# Patient Record
Sex: Female | Born: 1965 | Race: Black or African American | Hispanic: No | Marital: Single | Smoking: Never smoker
Health system: Southern US, Community
[De-identification: ages and names within clinical notes are randomized; demographics above are authoritative.]

## PROBLEM LIST (undated history)

## (undated) DIAGNOSIS — M109 Gout, unspecified: Secondary | ICD-10-CM

## (undated) DIAGNOSIS — G8929 Other chronic pain: Secondary | ICD-10-CM

## (undated) DIAGNOSIS — M549 Dorsalgia, unspecified: Secondary | ICD-10-CM

## (undated) DIAGNOSIS — M25569 Pain in unspecified knee: Secondary | ICD-10-CM

## (undated) DIAGNOSIS — I10 Essential (primary) hypertension: Secondary | ICD-10-CM

## (undated) DIAGNOSIS — B009 Herpesviral infection, unspecified: Secondary | ICD-10-CM

## (undated) HISTORY — PX: HERNIA REPAIR: SHX51

## (undated) HISTORY — PX: CHOLECYSTECTOMY: SHX55

---

## 2011-05-16 ENCOUNTER — Other Ambulatory Visit (HOSPITAL_COMMUNITY)
Admission: RE | Admit: 2011-05-16 | Discharge: 2011-05-16 | Disposition: A | Discharge status: Home / Self Care | Payer: Medicaid Other | Source: Ambulatory Visit | Attending: Obstetrics and Gynecology | Admitting: Obstetrics and Gynecology

## 2011-05-16 DIAGNOSIS — Z113 Encounter for screening for infections with a predominantly sexual mode of transmission: Secondary | ICD-10-CM | POA: Insufficient documentation

## 2011-05-16 DIAGNOSIS — Z01419 Encounter for gynecological examination (general) (routine) without abnormal findings: Secondary | ICD-10-CM | POA: Insufficient documentation

## 2012-06-17 ENCOUNTER — Other Ambulatory Visit (HOSPITAL_COMMUNITY)
Admission: RE | Admit: 2012-06-17 | Discharge: 2012-06-17 | Disposition: A | Discharge status: Home / Self Care | Payer: Medicaid Other | Source: Ambulatory Visit | Attending: Obstetrics and Gynecology | Admitting: Obstetrics and Gynecology

## 2012-06-17 DIAGNOSIS — Z124 Encounter for screening for malignant neoplasm of cervix: Secondary | ICD-10-CM | POA: Insufficient documentation

## 2012-06-17 DIAGNOSIS — Z113 Encounter for screening for infections with a predominantly sexual mode of transmission: Secondary | ICD-10-CM | POA: Insufficient documentation

## 2014-08-10 ENCOUNTER — Encounter (HOSPITAL_BASED_OUTPATIENT_CLINIC_OR_DEPARTMENT_OTHER): Payer: Self-pay

## 2014-08-10 DIAGNOSIS — I10 Essential (primary) hypertension: Secondary | ICD-10-CM | POA: Diagnosis not present

## 2014-08-10 DIAGNOSIS — Z8619 Personal history of other infectious and parasitic diseases: Secondary | ICD-10-CM | POA: Insufficient documentation

## 2014-08-10 DIAGNOSIS — M109 Gout, unspecified: Secondary | ICD-10-CM | POA: Diagnosis not present

## 2014-08-10 DIAGNOSIS — Z79899 Other long term (current) drug therapy: Secondary | ICD-10-CM | POA: Diagnosis not present

## 2014-08-10 DIAGNOSIS — G8929 Other chronic pain: Secondary | ICD-10-CM | POA: Diagnosis not present

## 2014-08-10 DIAGNOSIS — M25562 Pain in left knee: Secondary | ICD-10-CM | POA: Diagnosis present

## 2014-08-10 DIAGNOSIS — M1712 Unilateral primary osteoarthritis, left knee: Secondary | ICD-10-CM | POA: Diagnosis not present

## 2014-08-10 NOTE — ED Notes (Addendum)
C/o pain from left knee down leg posterior and anterior-started yesterday-denies injury-states she needs to have bilat knee replacement

## 2014-08-11 ENCOUNTER — Emergency Department (HOSPITAL_BASED_OUTPATIENT_CLINIC_OR_DEPARTMENT_OTHER)
Admission: EM | Admit: 2014-08-11 | Discharge: 2014-08-11 | Disposition: A | Discharge status: Home / Self Care | Payer: Medicaid Other | Attending: Emergency Medicine | Admitting: Emergency Medicine

## 2014-08-11 ENCOUNTER — Encounter (HOSPITAL_BASED_OUTPATIENT_CLINIC_OR_DEPARTMENT_OTHER): Payer: Self-pay | Admitting: Emergency Medicine

## 2014-08-11 ENCOUNTER — Emergency Department (HOSPITAL_BASED_OUTPATIENT_CLINIC_OR_DEPARTMENT_OTHER): Payer: Medicaid Other

## 2014-08-11 DIAGNOSIS — R52 Pain, unspecified: Secondary | ICD-10-CM

## 2014-08-11 DIAGNOSIS — M1990 Unspecified osteoarthritis, unspecified site: Secondary | ICD-10-CM

## 2014-08-11 HISTORY — DX: Gout, unspecified: M10.9

## 2014-08-11 HISTORY — DX: Essential (primary) hypertension: I10

## 2014-08-11 HISTORY — DX: Herpesviral infection, unspecified: B00.9

## 2014-08-11 HISTORY — DX: Pain in unspecified knee: M25.569

## 2014-08-11 HISTORY — DX: Dorsalgia, unspecified: M54.9

## 2014-08-11 HISTORY — DX: Other chronic pain: G89.29

## 2014-08-11 MED ORDER — METHOCARBAMOL 500 MG PO TABS
1000.0000 mg | ORAL_TABLET | Freq: Once | ORAL | Status: AC
  Administered 2014-08-11: 1000 mg via ORAL
  Filled 2014-08-11: qty 2

## 2014-08-11 MED ORDER — KETOROLAC TROMETHAMINE 60 MG/2ML IM SOLN
60.0000 mg | Freq: Once | INTRAMUSCULAR | Status: AC
  Administered 2014-08-11: 60 mg via INTRAMUSCULAR
  Filled 2014-08-11: qty 2

## 2014-08-11 MED ORDER — DEXAMETHASONE SODIUM PHOSPHATE 4 MG/ML IJ SOLN
4.0000 mg | Freq: Once | INTRAMUSCULAR | Status: AC
  Administered 2014-08-11: 4 mg via INTRAMUSCULAR
  Filled 2014-08-11: qty 1

## 2014-08-11 MED ORDER — MELOXICAM 7.5 MG PO TABS
7.5000 mg | ORAL_TABLET | Freq: Every day | ORAL | Status: AC
Start: 2014-08-11 — End: ?

## 2014-08-11 NOTE — ED Provider Notes (Signed)
CSN: [id number]     Arrival date & time 08/10/14  2220 History   First MD Initiated Contact with Patient 08/11/14 0114     Chief Complaint  Patient presents with  . Leg Pain     (Consider location/radiation/quality/duration/timing/severity/associated sxs/prior Treatment) Patient is a 48 y.o. female presenting with knee pain. The history is provided by the patient.  Knee Pain Location:  Knee Time since incident:  12 hours Injury: no (has chronic B knee pain that she used to get injections for she was supposed to have both knees repaired and was walking in parking lot at store today and does not remember injury but sudden worsening lateral left knee pain)   Knee location:  L knee Pain details:    Quality:  Sharp   Radiates to:  Does not radiate   Severity:  Severe   Onset quality:  Gradual   Timing:  Constant   Progression:  Worsening Chronicity:  Chronic Dislocation: no   Foreign body present:  No foreign bodies Prior injury to area:  No Relieved by:  Nothing Worsened by:  Nothing tried Ineffective treatments:  Muscle relaxant (lyrica and narcotic pain medication) Associated symptoms: no numbness and no swelling   Risk factors: no concern for non-accidental trauma     Past Medical History  Diagnosis Date  . Knee pain, chronic   . Hypertension   . Herpes simplex type 1 infection   . Gout   . Back pain, chronic    Past Surgical History  Procedure Laterality Date  . Cholecystectomy    . Hernia repair    . Cesarean section     No family history on file. History  Substance Use Topics  . Smoking status: Never Smoker   . Smokeless tobacco: Not on file  . Alcohol Use: No   OB History    No data available     Review of Systems  All other systems reviewed and are negative.     Allergies  Sulfa antibiotics; Other; and Red dye  Home Medications   Prior to Admission medications   Medication Sig Start Date End Date Taking? Authorizing Provider  COLCHICINE PO  Take by mouth.   Yes Historical Provider, MD  Cyclobenzaprine HCl (FLEXERIL PO) Take by mouth.   Yes Historical Provider, MD  LISINOPRIL PO Take by mouth.   Yes Historical Provider, MD  Loratadine 10 MG CAPS Take by mouth.   Yes Historical Provider, MD  Pregabalin (LYRICA PO) Take by mouth.   Yes Historical Provider, MD  Solifenacin Succinate (VESICARE PO) Take by mouth.   Yes Historical Provider, MD  TRAMADOL HCL PO Take by mouth.   Yes Historical Provider, MD  ValACYclovir HCl (VALTREX PO) Take by mouth.   Yes Historical Provider, MD  meloxicam (MOBIC) 7.5 MG tablet Take 1 tablet (7.5 mg total) by mouth daily. 08/11/14   Lynell Greenhouse K Kinlee Garrison-Rasch, MD   BP 151/95 mmHg  Pulse 84  Temp(Src) 98.6 F (37 C) (Oral)  Resp 20  Ht 5\' 6"  (1.676 m)  Wt 286 lb (129.729 kg)  BMI 46.18 kg/m2  SpO2 99% Physical Exam  Constitutional: She is oriented to person, place, and time. She appears well-developed and well-nourished. No distress.  HENT:  Head: Normocephalic and atraumatic.  Mouth/Throat: Oropharynx is clear and moist.  Eyes: Conjunctivae are normal. Pupils are equal, round, and reactive to light.  Neck: Normal range of motion. Neck supple.  Cardiovascular: Normal rate, regular rhythm and intact distal pulses.  Pulmonary/Chest: Effort normal and breath sounds normal. She has no wheezes. She has no rales.  Abdominal: Soft. Bowel sounds are normal. There is no tenderness. There is no rebound and no guarding.  Musculoskeletal: Normal range of motion.       Left knee: She exhibits normal range of motion, no swelling, no effusion, no ecchymosis, no deformity, no laceration, no erythema, normal alignment, no LCL laxity, normal patellar mobility, no bony tenderness, normal meniscus and no MCL laxity. No tenderness found. No medial joint line, no lateral joint line, no MCL, no LCL and no patellar tendon tenderness noted.  Neurological: She is alert and oriented to person, place, and time.  Skin: Skin is  warm and dry.  Psychiatric: She has a normal mood and affect.    ED Course  Procedures (including critical care time) Labs Review Labs Reviewed - No data to display  Imaging Review Dg Knee Complete 4 Views Left  08/11/2014   CLINICAL DATA:  Severe left knee pain for the past 2 days  EXAM: LEFT KNEE - COMPLETE 4+ VIEW  COMPARISON:  None.  FINDINGS: No joint effusion noted. There is no fracture or subluxation identified. Moderate tricompartment osteoarthritis with exuberant marginal spur formation and medial and patellofemoral compartment narrowing is noted. Loose bodies are identified within the anterior joint space.  IMPRESSION: 1. Moderate tricompartment osteoarthritis.   Electronically Signed   By: Signa Kellaylor  Stroud M.D.   On: 08/11/2014 02:14     EKG Interpretation None      MDM   Final diagnoses:  Pain  Arthritis    Knee immobilizer placed follow up with your orthopedic surgeon to discuss ongoing management of your severe arthritis.      Jasmine AweApril K Anila Bojarski-Rasch, MD 08/11/14 [phone]

## 2014-08-11 NOTE — ED Notes (Signed)
Pt to xray via stretcher

## 2015-08-28 ENCOUNTER — Ambulatory Visit: Payer: Self-pay | Admitting: Podiatry

## 2015-08-30 ENCOUNTER — Other Ambulatory Visit: Payer: Self-pay

## 2015-08-30 ENCOUNTER — Ambulatory Visit (INDEPENDENT_AMBULATORY_CARE_PROVIDER_SITE_OTHER): Payer: Medicaid Other | Admitting: Podiatry

## 2015-08-30 ENCOUNTER — Encounter: Payer: Self-pay | Admitting: Podiatry

## 2015-08-30 VITALS — BP 167/101 | HR 96 | Ht 66.0 in | Wt 280.0 lb

## 2015-08-30 DIAGNOSIS — M216X2 Other acquired deformities of left foot: Secondary | ICD-10-CM

## 2015-08-30 DIAGNOSIS — M216X1 Other acquired deformities of right foot: Secondary | ICD-10-CM

## 2015-08-30 DIAGNOSIS — M216X9 Other acquired deformities of unspecified foot: Secondary | ICD-10-CM | POA: Diagnosis not present

## 2015-08-30 DIAGNOSIS — M722 Plantar fascial fibromatosis: Secondary | ICD-10-CM | POA: Diagnosis not present

## 2015-08-30 DIAGNOSIS — M21969 Unspecified acquired deformity of unspecified lower leg: Secondary | ICD-10-CM | POA: Diagnosis not present

## 2015-08-30 NOTE — Progress Notes (Signed)
SUBJECTIVE: 49 y.o. year old female presents with pain in left heel.  Left heel pain x 1 month. 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 Using CAM walker she received from Orthopedic doctor who was treating her for knee pain 2 weeks ago. Has had cortisone injection in past for back and knee that did not help her.  Been having knee problem for about 2 years.  CAM walker helps some. Already taking 3 pain medications.  Need to have knee replacement once she lose 30 lbs.   REVIEW OF SYSTEMS: Constitutional: negative for chills, fatigue, fevers, night sweats, sweats and weight loss Eyes: negative Ears, nose, mouth, throat, and face: negative Respiratory: negative Cardiovascular: negative Gastrointestinal: negative Genitourinary:negative Integument/breast: negative Musculoskeletal:negative Neurological: negative Behavioral/Psych: negative Endocrine: negative Allergic/Immunologic: negative  Positive for hiatal hernia.   OBJECTIVE: DERMATOLOGIC EXAMINATION: Nails: normal appearing nails bilaterally Skin Integrity: Normal findings.  VASCULAR EXAMINATION OF LOWER LIMBS: Pedal pulses: All pedal pulses are palpable with normal pulsation.  Temperature gradient from tibial crest to dorsum of foot is within normal bilateral. NEUROLOGIC EXAMINATION OF THE LOWER LIMBS: All epicritic and tactile sensations grossly intact. MUSCULOSKELETAL EXAMINATION: Positive for unstable first ray bilateral. Left plantar heel pain.   RADIOGRAPHIC STUDIES:  General overview: Negative of Osteopenia Absence of soft tissue swelling. Absence of acute osseous or articular surfaces of forefoot or rearfoot.  AP View:  Short first ray (-3), fibular sesamoid position 5 bilateral,  increased lateral deviation angle of Calcaneocuboid angle bilateral , phalangeal spur proximal phalanx 5th left,  Lateral view:  Pronated foot with midtarsal sagging  Dorsal lipping at Naviculocuneiform and metatarsocuneiform bilateral. Decreased Calcaneal  inclination angle, Positive of posterior heel spur bilateral, positive of inferior calcaneal spur, positive of first ray elevatus bilateral, Positive of anterior and plantar advancement of the Talar head.  ASSESSMENT: Plantar fasciitis left. Unstable first ray bilateral. STJ excess pronation bilateral.  PLAN: Reviewed clinical findings and all available treatment options, orthotics OTC and custom, Cortisone injection,

## 2015-08-30 NOTE — Patient Instructions (Signed)
Seen for left heel pain. Noted of unstable first metatarsal bone with small bone spur left foot. May benefit from cortisone injection, Orthotics custom or OTC, NSAIA, pain medication, change in shoe gear. Return as needed.

## 2017-07-22 ENCOUNTER — Emergency Department (HOSPITAL_BASED_OUTPATIENT_CLINIC_OR_DEPARTMENT_OTHER)
Admission: EM | Admit: 2017-07-22 | Discharge: 2017-07-23 | Disposition: A | Discharge status: Home / Self Care | Payer: Self-pay | Attending: Emergency Medicine | Admitting: Emergency Medicine

## 2017-07-22 ENCOUNTER — Other Ambulatory Visit: Payer: Self-pay

## 2017-07-22 ENCOUNTER — Emergency Department (HOSPITAL_BASED_OUTPATIENT_CLINIC_OR_DEPARTMENT_OTHER): Payer: Self-pay

## 2017-07-22 ENCOUNTER — Encounter (HOSPITAL_BASED_OUTPATIENT_CLINIC_OR_DEPARTMENT_OTHER): Payer: Self-pay | Admitting: Emergency Medicine

## 2017-07-22 DIAGNOSIS — I209 Angina pectoris, unspecified: Secondary | ICD-10-CM | POA: Insufficient documentation

## 2017-07-22 DIAGNOSIS — I10 Essential (primary) hypertension: Secondary | ICD-10-CM | POA: Insufficient documentation

## 2017-07-22 DIAGNOSIS — Z79899 Other long term (current) drug therapy: Secondary | ICD-10-CM | POA: Insufficient documentation

## 2017-07-22 DIAGNOSIS — R0602 Shortness of breath: Secondary | ICD-10-CM | POA: Insufficient documentation

## 2017-07-22 DIAGNOSIS — R079 Chest pain, unspecified: Secondary | ICD-10-CM

## 2017-07-22 LAB — CBC WITH DIFFERENTIAL/PLATELET
BASOS ABS: 0 10*3/uL (ref 0.0–0.1)
Basophils Relative: 0 %
Eosinophils Absolute: 0.2 10*3/uL (ref 0.0–0.7)
Eosinophils Relative: 2 %
HCT: 39.3 % (ref 36.0–46.0)
Hemoglobin: 12.7 g/dL (ref 12.0–15.0)
LYMPHS ABS: 4.2 10*3/uL — AB (ref 0.7–4.0)
Lymphocytes Relative: 52 %
MCH: 30.8 pg (ref 26.0–34.0)
MCHC: 32.3 g/dL (ref 30.0–36.0)
MCV: 95.4 fL (ref 78.0–100.0)
Monocytes Absolute: 0.4 10*3/uL (ref 0.1–1.0)
Monocytes Relative: 5 %
NEUTROS ABS: 3.3 10*3/uL (ref 1.7–7.7)
Neutrophils Relative %: 41 %
Platelets: 250 10*3/uL (ref 150–400)
RBC: 4.12 MIL/uL (ref 3.87–5.11)
RDW: 13.1 % (ref 11.5–15.5)
WBC: 8 10*3/uL (ref 4.0–10.5)

## 2017-07-22 LAB — BASIC METABOLIC PANEL
ANION GAP: 7 (ref 5–15)
BUN: 12 mg/dL (ref 6–20)
CHLORIDE: 107 mmol/L (ref 101–111)
CO2: 26 mmol/L (ref 22–32)
Calcium: 8.8 mg/dL — ABNORMAL LOW (ref 8.9–10.3)
Creatinine, Ser: 0.97 mg/dL (ref 0.44–1.00)
GFR calc Af Amer: >60 mL/min (ref >60)
GLUCOSE: 92 mg/dL (ref 65–99)
POTASSIUM: 3.3 mmol/L — AB (ref 3.5–5.1)
Sodium: 140 mmol/L (ref 135–145)

## 2017-07-22 LAB — TROPONIN I: Troponin I: <0.03 ng/mL (ref <0.03)

## 2017-07-22 MED ORDER — ASPIRIN 81 MG PO CHEW
324.0000 mg | CHEWABLE_TABLET | Freq: Once | ORAL | Status: AC
  Administered 2017-07-22: 324 mg via ORAL
  Filled 2017-07-22: qty 4

## 2017-07-22 NOTE — ED Triage Notes (Signed)
Pt c/o aching type pain in left upper chest that radiates to left arm with associated shob. Pt reports onset of pain 2 months ago.

## 2017-07-22 NOTE — ED Provider Notes (Addendum)
[facility] EMERGENCY DEPARTMENT Provider Note   CSN: [id number] Arrival date & time: 07/22/17  2107     History   Chief Complaint Chief Complaint  Patient presents with  . Chest Pain    HPI Megan Collier is a 51 y.o. female.  HPI 51 year old female with history of obesity, hypertension who comes in with chief complaint of chest pain.  Patient reports that she has been having left-sided chest discomfort for the past 7 straight days.  Pain is constant, radiates down towards the left elbow.  There is no specific aggravating or relieving factors, specifically pain is not worse with exertion or with any position change or with deep inspiration.  Patient reports that she has associated shortness of breath, and she also has noted dyspnea on exertion.  No nausea, no diaphoresis.  Patient has no history of coronary artery disease or any provocative cardiac test.  Pt has no hx of PE, DVT and denies any exogenous hormone (testosterone / estrogen) use, long distance travels or surgery in the past 6 weeks, active cancer, recent immobilization.   Past Medical History:  Diagnosis Date  . Back pain, chronic   . Gout   . Herpes simplex type 1 infection   . Hypertension   . Knee pain, chronic     Patient Active Problem List   Diagnosis Date Noted  . Plantar fasciitis of left foot 08/30/2015  . Metatarsal deformity 08/30/2015  . Pronation deformity of both feet 08/30/2015    Past Surgical History:  Procedure Laterality Date  . CESAREAN SECTION    . CHOLECYSTECTOMY    . HERNIA REPAIR      OB History    No data available       Home Medications    Prior to Admission medications   Medication Sig Start Date End Date Taking? Authorizing Provider  COLCHICINE PO Take by mouth.    [provider]  Cyclobenzaprine HCl (FLEXERIL PO) Take by mouth.    [provider]  ibuprofen (ADVIL,MOTRIN) 600 MG tablet Take 1 tablet (600 mg total) every 6 (six) hours as  needed by mouth. 07/23/17   Derwood KaplanNanavati, Erial Fikes, MD  LISINOPRIL PO Take by mouth.    [provider]  Loratadine 10 MG CAPS Take by mouth.    [provider]  meloxicam (MOBIC) 7.5 MG tablet Take 1 tablet (7.5 mg total) by mouth daily. 08/11/14   Palumbo, April, MD  Pregabalin (LYRICA PO) Take by mouth.    [provider]  Solifenacin Succinate (VESICARE PO) Take by mouth.    [provider]  TRAMADOL HCL PO Take by mouth.    [provider]  ValACYclovir HCl (VALTREX PO) Take by mouth.    [provider]    Family History No family history on file.  Social History Social History   Tobacco Use  . Smoking status: Never Smoker  . Smokeless tobacco: Never Used  Substance Use Topics  . Alcohol use: No    Alcohol/week: 0.0 oz  . Drug use: No     Allergies   Epinephrine; Sulfa antibiotics; Other; and Red dye   Review of Systems Review of Systems  Constitutional: Negative for diaphoresis.  Respiratory: Positive for shortness of breath.   Cardiovascular: Positive for chest pain.  Gastrointestinal: Negative for nausea.  All other systems reviewed and are negative.    Physical Exam Updated Vital Signs BP 133/83   Pulse 73   Temp 98.1 F (36.7 C) (Oral)  Resp 19   Ht 5\' 6"  (1.676 m)   Wt 111.1 kg (245 lb)   SpO2 97%   BMI 39.54 kg/m   Physical Exam  Constitutional: She is oriented to person, place, and time. She appears well-developed.  HENT:  Head: Normocephalic and atraumatic.  Eyes: EOM are normal.  Neck: Normal range of motion. Neck supple.  Cardiovascular: Normal rate, regular rhythm and normal pulses.  Pulmonary/Chest: Effort normal.  Abdominal: Bowel sounds are normal.  Neurological: She is alert and oriented to person, place, and time.  Skin: Skin is warm and dry.  Nursing note and vitals reviewed.    ED Treatments / Results  Labs (all labs ordered are listed, but only abnormal results are  displayed) Labs Reviewed  CBC WITH DIFFERENTIAL/PLATELET - Abnormal; Notable for the following components:      Result Value   Lymphs Abs 4.2 (*)    All other components within normal limits  BASIC METABOLIC PANEL - Abnormal; Notable for the following components:   Potassium 3.3 (*)    Calcium 8.8 (*)    All other components within normal limits  TROPONIN I  TROPONIN I  D-DIMER, QUANTITATIVE (NOT AT [facility])  BRAIN NATRIURETIC PEPTIDE    EKG  EKG Interpretation  Date/Time:  Wednesday July 22 2017 21:09:34 EST Ventricular Rate:  75 PR Interval:  166 QRS Duration: 90 QT Interval:  396 QTC Calculation: 442 R Axis:   13 Text Interpretation:  Normal sinus rhythm Normal ECG No significant change since last tracing Confirmed by Melene Plan [phone]) on 07/22/2017 10:40:06 PM       Radiology Dg Chest 2 View  Result Date: 07/22/2017 CLINICAL DATA:  Shortness of breath and left upper chest pain and left arm pain for 2 weeks. History of hypertension. Nonsmoker. EXAM: CHEST  2 VIEW COMPARISON:  None. FINDINGS: The heart size and mediastinal contours are within normal limits. Both lungs are clear. The visualized skeletal structures are unremarkable. IMPRESSION: No active cardiopulmonary disease. Electronically Signed   By: Burman Nieves M.D.   On: 07/22/2017 22:14    Procedures Procedures (including critical care time)  Medications Ordered in ED Medications  aspirin chewable tablet 324 mg (324 mg Oral Given 07/22/17 2350)     Initial Impression / Assessment and Plan / ED Course  I have reviewed the triage vital signs and the nursing notes.  Pertinent labs & imaging results that were available during my care of the patient were reviewed by me and considered in my medical decision making (see chart for details).  Clinical Course as of Jul 23 605  Thu Jul 23, 2017  6045 Patient reassessed. Pt is comfortable at this time.  Results of the workup discussed. Strict ER return  precautions discussed. Follow up instruction discussed, and pt agrees with the plan and is comfortable with it.   [AN]  0222 Results in the ER all are normal.  We will start patient on ibuprofen. Strict ER return precautions have been discussed, and patient is agreeing with the plan and is comfortable with the workup done and the recommendations from the ER.   [AN]    Clinical Course User Index [AN] Derwood Kaplan, MD    Patient comes in with chief complaint of left-sided chest pain, that is radiating down towards the left elbow.  Patient's pain has been constant for a week, which makes it atypical for ACS.  On the other side patient's pain is not consistent with pleuritic component, musculoskeletal component.  Cardiac labs have been ordered.  EKG is reassuring. HEAR score is 3 (history, age and risk factors).  Differential also includes congestive heart failure and PE. D-dimer and BNP ordered.    Final Clinical Impressions(s) / ED Diagnoses   Final diagnoses:  Angina pectoris (HCC)  Nonspecific chest pain    ED Discharge Orders        Ordered    ibuprofen (ADVIL,MOTRIN) 600 MG tablet  Every 6 hours PRN     07/23/17 0218       Derwood KaplanNanavati, Evelia Waskey, MD 07/23/17 [id number]    Keonda Dow, MD 07/23/17 [phone]

## 2017-07-23 LAB — D-DIMER, QUANTITATIVE: D-Dimer, Quant: 0.48 ug/mL-FEU (ref 0.00–0.50)

## 2017-07-23 LAB — TROPONIN I: Troponin I: <0.03 ng/mL (ref <0.03)

## 2017-07-23 LAB — BRAIN NATRIURETIC PEPTIDE: B Natriuretic Peptide: 23.4 pg/mL (ref 0.0–100.0)

## 2017-07-23 MED ORDER — IBUPROFEN 600 MG PO TABS: 600.0000 mg | ORAL_TABLET | Freq: Four times a day (QID) | ORAL | 0 refills | Status: AC | PRN

## 2017-07-23 NOTE — Discharge Instructions (Signed)
We saw you in the ER for the chest pain/shortness of breath. ?All of our cardiac workup is normal, including labs, EKG and chest X-RAY are normal. ?We are not sure what is causing your discomfort, but we feel comfortable sending you home at this time. The workup in the ER is not complete, and you should follow up with your primary care doctor for further evaluation. ? ?Please return to the ER if you have worsening chest pain, shortness of breath, pain radiating to your jaw, shoulder, or back, sweats or fainting.  ?

## 2018-10-14 ENCOUNTER — Encounter (HOSPITAL_BASED_OUTPATIENT_CLINIC_OR_DEPARTMENT_OTHER): Payer: Self-pay | Admitting: *Deleted

## 2018-10-14 ENCOUNTER — Other Ambulatory Visit: Payer: Self-pay

## 2018-10-14 ENCOUNTER — Emergency Department (HOSPITAL_BASED_OUTPATIENT_CLINIC_OR_DEPARTMENT_OTHER)
Admission: EM | Admit: 2018-10-14 | Discharge: 2018-10-15 | Disposition: A | Discharge status: Home / Self Care | Payer: Medicaid Other | Attending: Emergency Medicine | Admitting: Emergency Medicine

## 2018-10-14 ENCOUNTER — Emergency Department (HOSPITAL_COMMUNITY): Payer: Medicaid Other

## 2018-10-14 ENCOUNTER — Emergency Department (HOSPITAL_BASED_OUTPATIENT_CLINIC_OR_DEPARTMENT_OTHER): Payer: Medicaid Other

## 2018-10-14 DIAGNOSIS — I10 Essential (primary) hypertension: Secondary | ICD-10-CM | POA: Insufficient documentation

## 2018-10-14 DIAGNOSIS — R42 Dizziness and giddiness: Secondary | ICD-10-CM | POA: Diagnosis not present

## 2018-10-14 DIAGNOSIS — H81399 Other peripheral vertigo, unspecified ear: Secondary | ICD-10-CM

## 2018-10-14 DIAGNOSIS — Z79899 Other long term (current) drug therapy: Secondary | ICD-10-CM | POA: Diagnosis not present

## 2018-10-14 LAB — CBG MONITORING, ED: Glucose-Capillary: 96 mg/dL (ref 70–99)

## 2018-10-14 MED ORDER — LORAZEPAM 2 MG/ML IJ SOLN
1.0000 mg | Freq: Once | INTRAMUSCULAR | Status: AC
  Administered 2018-10-14: 1 mg via INTRAVENOUS
  Filled 2018-10-14: qty 1

## 2018-10-14 MED ORDER — DIAZEPAM 5 MG PO TABS
5.0000 mg | ORAL_TABLET | Freq: Once | ORAL | Status: AC
  Administered 2018-10-14: 5 mg via ORAL
  Filled 2018-10-14: qty 1

## 2018-10-14 MED ORDER — MECLIZINE HCL 25 MG PO TABS
25.0000 mg | ORAL_TABLET | Freq: Once | ORAL | Status: AC
  Administered 2018-10-14: 25 mg via ORAL
  Filled 2018-10-14: qty 1

## 2018-10-14 MED ORDER — MECLIZINE HCL 25 MG PO TABS: 25.0000 mg | ORAL_TABLET | Freq: Three times a day (TID) | ORAL | 0 refills | Status: AC | PRN

## 2018-10-14 NOTE — ED Provider Notes (Signed)
[facility] EMERGENCY DEPARTMENT Provider Note   CSN: [id number] Arrival date & time: 10/14/18  0845     History   Chief Complaint Chief Complaint  Patient presents with  . Dizziness    HPI Megan Collier is a 53 y.o. female.  53yo F w/ PMH including HTN, HSV, back pain, gout who p/w dizziness. This morning at 7am she woke up feeling dizzy which she reports as spinning sensation.  She tried closing eyes which didn't help. Movements of her head make symptoms worse. No N/V, visual changes, headache, weakness or numbness. No recent illness or recent new medications.  She reports a few similar episodes many years ago.   The history is provided by the patient.    Past Medical History:  Diagnosis Date  . Back pain, chronic   . Gout   . Herpes simplex type 1 infection   . Hypertension   . Knee pain, chronic     Patient Active Problem List   Diagnosis Date Noted  . Plantar fasciitis of left foot 08/30/2015  . Metatarsal deformity 08/30/2015  . Pronation deformity of both feet 08/30/2015    Past Surgical History:  Procedure Laterality Date  . CESAREAN SECTION    . CHOLECYSTECTOMY    . HERNIA REPAIR       OB History   No obstetric history on file.      Home Medications    Prior to Admission medications   Medication Sig Start Date End Date Taking? Authorizing Provider  COLCHICINE PO Take by mouth.   Yes [provider]  ibuprofen (ADVIL,MOTRIN) 600 MG tablet Take 1 tablet (600 mg total) every 6 (six) hours as needed by mouth. 07/23/17  Yes Derwood KaplanNanavati, Ankit, MD  Loratadine 10 MG CAPS Take by mouth.   Yes [provider]  Pregabalin (LYRICA PO) Take by mouth.   Yes [provider]  ValACYclovir HCl (VALTREX PO) Take by mouth.   Yes [provider]  Cyclobenzaprine HCl (FLEXERIL PO) Take by mouth.    [provider]  LISINOPRIL PO Take by mouth.    [provider]  meloxicam (MOBIC) 7.5 MG tablet Take 1  tablet (7.5 mg total) by mouth daily. 08/11/14   Palumbo, April, MD  Solifenacin Succinate (VESICARE PO) Take by mouth.    [provider]  TRAMADOL HCL PO Take by mouth.    [provider]    Family History History reviewed. No pertinent family history.  Social History Social History   Tobacco Use  . Smoking status: Never Smoker  . Smokeless tobacco: Never Used  Substance Use Topics  . Alcohol use: No    Alcohol/week: 0.0 standard drinks  . Drug use: No     Allergies   Epinephrine; Sulfa antibiotics; Other; and Red dye   Review of Systems Review of Systems All other systems reviewed and are negative except that which was mentioned in HPI   Physical Exam Updated Vital Signs BP (!) 158/104   Pulse 77   Temp 98 F (36.7 C) (Oral)   Resp (!) 22   Ht 5\' 6"  (1.676 m)   Wt 122.5 kg   SpO2 97%   BMI 43.58 kg/m   Physical Exam Vitals signs and nursing note reviewed.  Constitutional:      General: She is not in acute distress.    Appearance: She is well-developed.     Comments: Awake, alert  HENT:     Head: Normocephalic and atraumatic.  Right Ear: Tympanic membrane and ear canal normal.     Left Ear: Tympanic membrane and ear canal normal.  Eyes:     Extraocular Movements: Extraocular movements intact.     Conjunctiva/sclera: Conjunctivae normal.     Pupils: Pupils are equal, round, and reactive to light.  Neck:     Musculoskeletal: Neck supple.  Cardiovascular:     Rate and Rhythm: Normal rate and regular rhythm.     Heart sounds: Normal heart sounds. No murmur.  Pulmonary:     Effort: Pulmonary effort is normal. No respiratory distress.     Breath sounds: Normal breath sounds.  Abdominal:     General: Bowel sounds are normal. There is no distension.     Palpations: Abdomen is soft.     Tenderness: There is no abdominal tenderness.  Skin:    General: Skin is warm and dry.  Neurological:     Mental Status: She is alert and oriented  to person, place, and time.     Cranial Nerves: No cranial nerve deficit.     Motor: No abnormal muscle tone.     Deep Tendon Reflexes: Reflexes are normal and symmetric.     Comments: Fluent speech, normal finger-to-nose testing, negative pronator drift, no clonus 5/5 strength and normal sensation x all 4 extremities  Psychiatric:        Thought Content: Thought content normal.        Judgment: Judgment normal.      ED Treatments / Results  Labs (all labs ordered are listed, but only abnormal results are displayed) Labs Reviewed  CBG MONITORING, ED    EKG EKG Interpretation  Date/Time:  Thursday October 14 2018 09:45:47 EST Ventricular Rate:  73 PR Interval:    QRS Duration: 97 QT Interval:  431 QTC Calculation: 475 R Axis:   29 Text Interpretation:  Sinus rhythm No significant change since last tracing Confirmed by Frederick Peers [phone]) on 10/14/2018 9:53:20 AM Also confirmed by Frederick Peers [phone]), editor Lodema Hong, Tamera Punt ([id number])  on 10/14/2018 11:50:34 AM   Radiology Ct Head Wo Contrast  Result Date: 10/14/2018 CLINICAL DATA:  Dizziness since this morning. EXAM: CT HEAD WITHOUT CONTRAST TECHNIQUE: Contiguous axial images were obtained from the base of the skull through the vertex without intravenous contrast. COMPARISON:  None. FINDINGS: Brain: The ventricles are normal in size and configuration. No extra-axial fluid collections are identified. The gray-white differentiation is maintained. No CT findings for acute hemispheric infarction or intracranial hemorrhage. No mass lesions. The brainstem and cerebellum are normal. Vascular: No hyperdense vessels or obvious aneurysm. Skull: No acute skull fracture.  No bone lesion. Sinuses/Orbits: The paranasal sinuses and mastoid air cells are clear. The globes are intact. Other: No scalp lesions, laceration or hematoma. IMPRESSION: Normal head CT. Electronically Signed   By: Rudie Meyer M.D.   On: 10/14/2018 14:09     Procedures Procedures (including critical care time)  Medications Ordered in ED Medications  meclizine (ANTIVERT) tablet 25 mg (25 mg Oral Given 10/14/18 0959)  LORazepam (ATIVAN) injection 1 mg (1 mg Intravenous Given 10/14/18 1138)  meclizine (ANTIVERT) tablet 25 mg (25 mg Oral Given 10/14/18 1342)     Initial Impression / Assessment and Plan / ED Course  I have reviewed the triage vital signs and the nursing notes.  Pertinent labs  that were available during my care of the patient were reviewed by me and considered in my medical decision making (see chart for details).    Neuro  intact on exam, no obvious deficits. Gave meclizine, then ativan for persistent intermittent symptoms.  On reassessment, she states she is still persistently dizzy even when not moving. Advised that due to persistent sx, would be more concerned about central process. Head CT negative acute.   Recommended transfer to [facility] for MRI/MRA to r/o posterior stroke or posterior circulation pathology. Discussed ED to ED transfer with Dr. Rhunette Croft. Pt will be sent to [facility] ED for MR imaging. If negative and she is able to ambulate, feels improved, then I anticipate discharge. If abnormalities on imaging, she will require neurology consultation.   Final Clinical Impressions(s) / ED Diagnoses   Final diagnoses:  Dizziness    ED Discharge Orders    None       Gemini Bunte, Ambrose Finland, MD 10/14/18 [phone]

## 2018-10-14 NOTE — ED Notes (Signed)
Pt transported to MRI via stretcher.  

## 2018-10-14 NOTE — ED Notes (Signed)
Pt given peanut butter, graham crackers, and sprite

## 2018-10-14 NOTE — ED Notes (Signed)
Pt transported to MRI 

## 2018-10-14 NOTE — ED Triage Notes (Signed)
Pt woke up at 7:00am and was dizzy.

## 2018-10-14 NOTE — ED Provider Notes (Signed)
MSE was initiated and I personally evaluated the patient and placed orders (if any) at  5:20 PM on October 14, 2018.  The patient appears stable so that the remainder of the MSE may be completed by another provider.  Care assumed from Dr. Clarene Duke who evaluated patient at bedside at [facility].  See her notes for additional details.  Briefly, patient developed room spinning sensation and difficulty walking with nausea and vomiting that was nonbloody ambulate since this morning.  Evaluated outside hospital with concern for vertigo that may have central cause.  Treated with meclizine and Ativan prior to arrival with some improvement in her symptoms.  Largely asymptomatic when not moving at this time but did have symptoms when transferring from the stretcher.  Patient has bidirectional horizontal nystagmus when having her vertigo and lightheadedness.  Has had some pressure sensation in her ears with decreased hearing but no tinnitus.  No ear pain.  No recent falls or trauma.  No neurologic deficits on exam.  Strength is 5 out of 5 bilaterally and sensation is normal.  No dysmetria or dysdiadochokinesia.  Heel-to-shin is normal in the both lower extremities.  Normal test of skew.  Head impulse test with corrective saccade to return to midline when turning head to the right.  Symptoms brought on with Dix-Hallpike maneuver.  No visual field deficits and visual acuity is at baseline.  No carotid bruit.  No chest pain or dyspnea and pulses are equal in the bilateral upper extremities.  Doubt dissection.  Her symptoms and exam are most consistent with a peripheral etiology such as BPPV, Mnire's disease, or laryngitis.  However, given the short duration of her symptoms have a low suspicion for labyrinthitis/vestibular neuronitis.  Lower suspicion for Mnire's disease.  Most consistent with BPPV.  However, given her persistent symptoms requires MRI and MRA as ordered by previous team.  Given additional 5 mg of p.o.  Valium in the ED.  MR studies show mild periventricular white matter hyperintensity, nonspecific but most commonly seen in the setting of chronic small vessel disease.  No acute intracranial abnormality.  Normal intracranial MRA.  Symptoms are most consistent with peripheral vertigo.  Patient was able to ambulate with only minimal assistance thereafter to the restroom and back.  She was hungry and ate a meal in the ED with no difficulty and no nausea or vomiting.  Thereafter, she felt much better and would like to be discharged home.  Discharged home with prescription for as needed meclizine and counseled to follow-up with her PCP.  She states that her son will be coming to stay with her and help her significant out the next 1 to 2 days as needed.  She was counseled on return precautions and verbalized understanding.   Kimoni Pagliarulo, Sherryle Lis, MD 10/14/18 [id number]    Derwood Kaplan, MD 10/15/18 [phone]

## 2018-10-14 NOTE — ED Notes (Signed)
Pt feeling ok after drinking and water

## 2018-10-15 NOTE — ED Notes (Signed)
Patient verbalizes understanding of discharge instructions. Opportunity for questioning and answers were provided. Armband removed by staff, pt discharged from ED home via POV.  

## 2019-07-15 IMAGING — CT CT HEAD W/O CM
3 series · 15 of 47 positions shown, 18 images · non-contrast
Comparison: None.

CLINICAL DATA: Dizziness since this morning.

EXAM:
CT HEAD WITHOUT CONTRAST
TECHNIQUE: Contiguous axial images were obtained from the base of the skull
through the vertex without intravenous contrast.

[Series 2: head wo · axial · 0.44mm/px · z∈[-174,-49]mm · 9 of 31 slices shown, 12 images]
[im 3/31  brain]
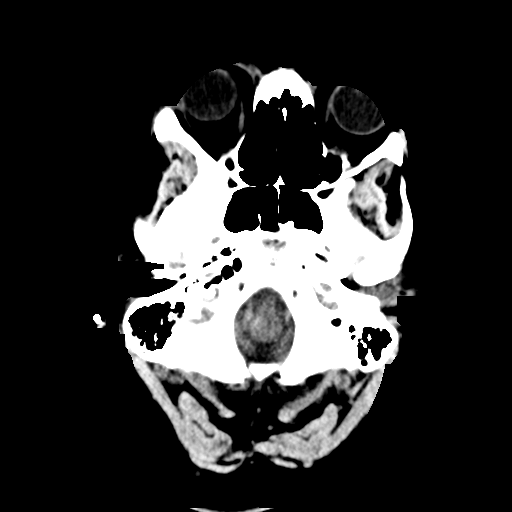
[im 3/31  bone]
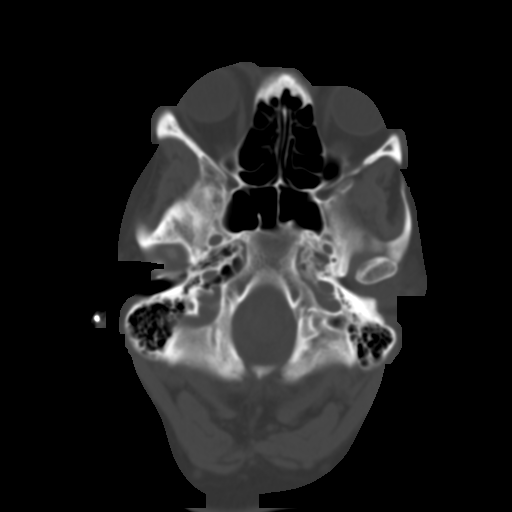
[im 6/31  brain]
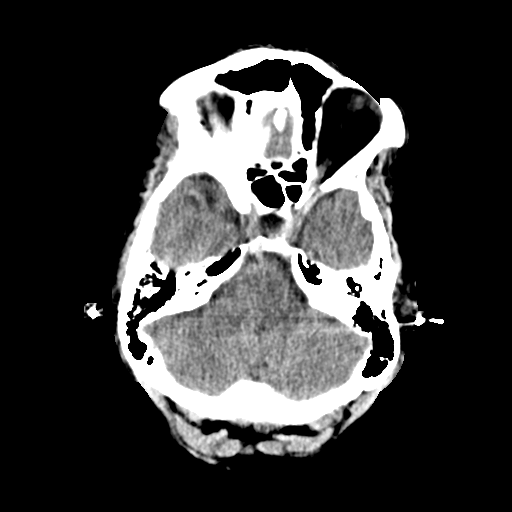
[im 9/31  brain]
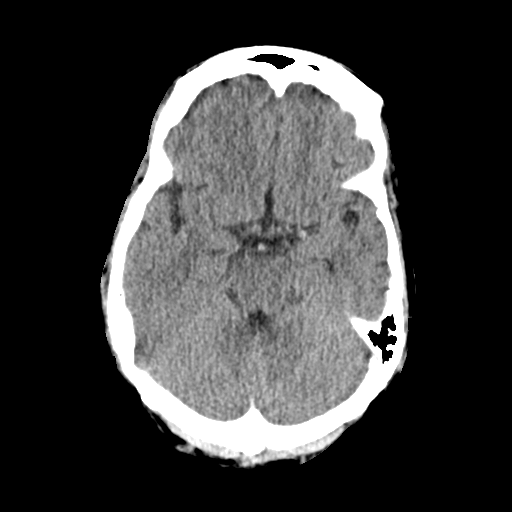
[im 12/31  brain]
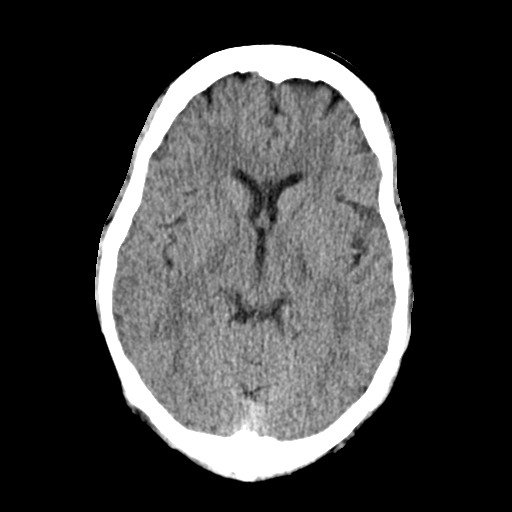
[im 16/31  brain]
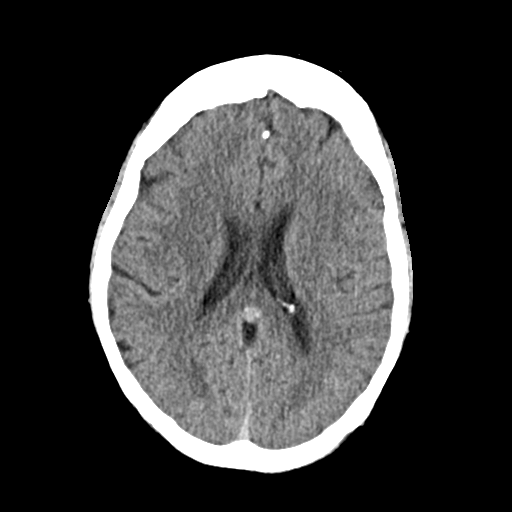
[im 16/31  bone]
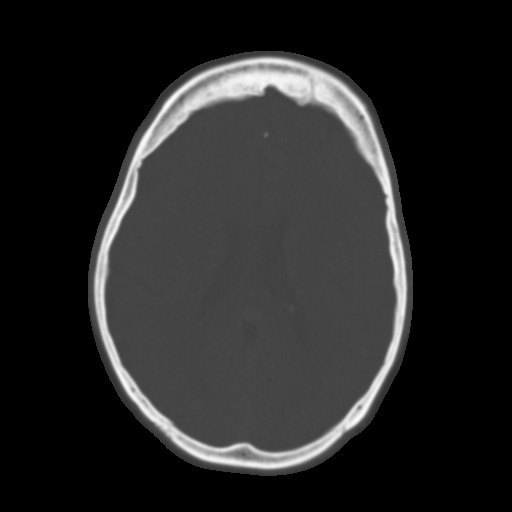
[im 19/31  brain]
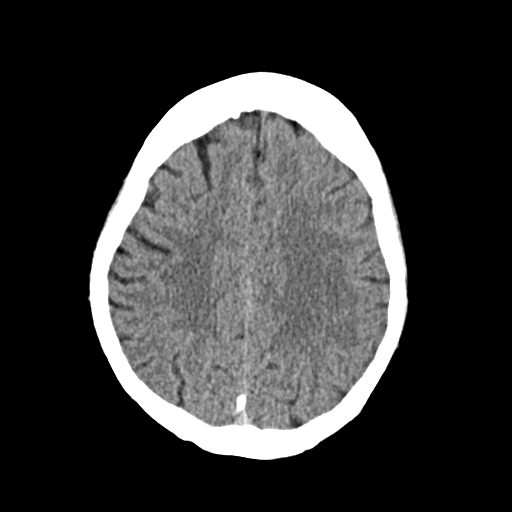
[im 22/31  brain]
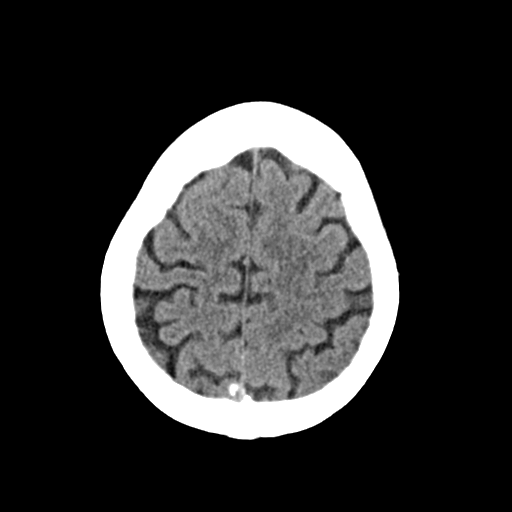
[im 25/31  brain]
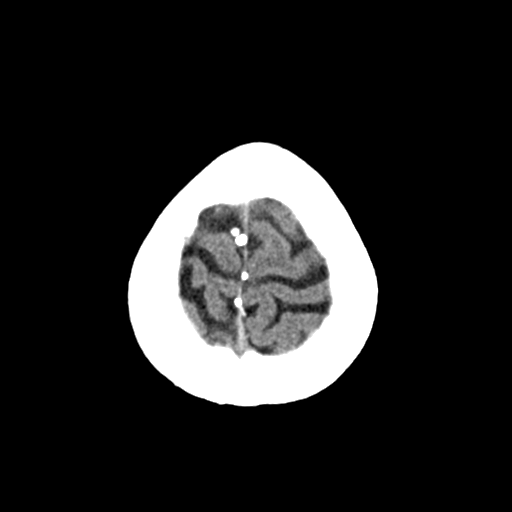
[im 28/31  brain]
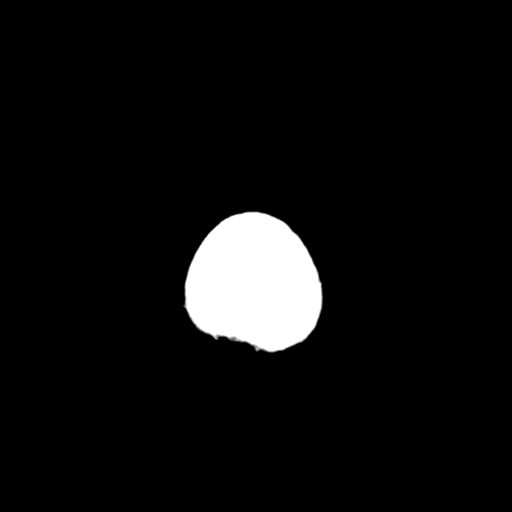
[im 28/31  bone]
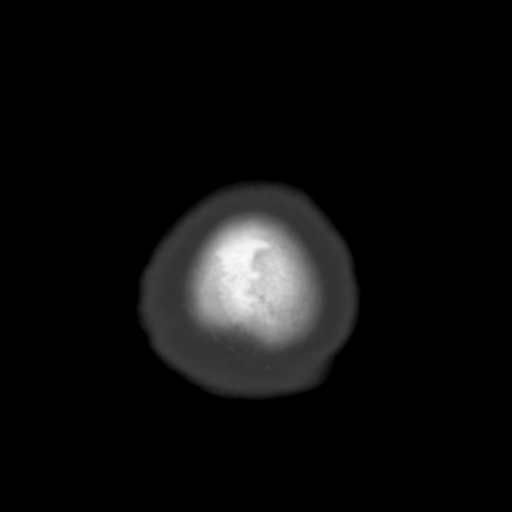

[Series 4: coronal soft · coronal · 0.31mm/px · 3 of 72 slices shown]
[im 24/72  brain]
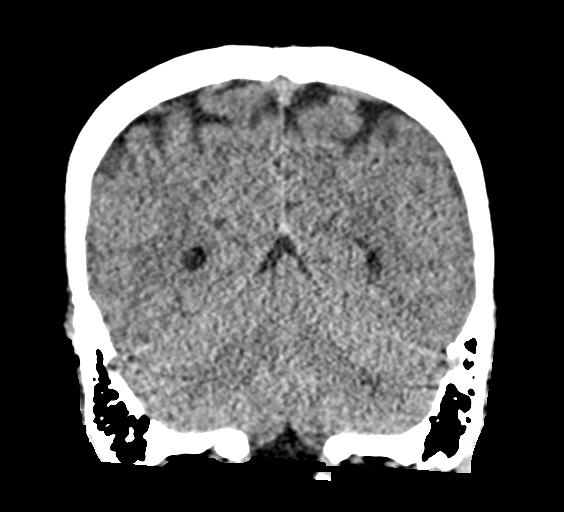
[im 32/72  brain]
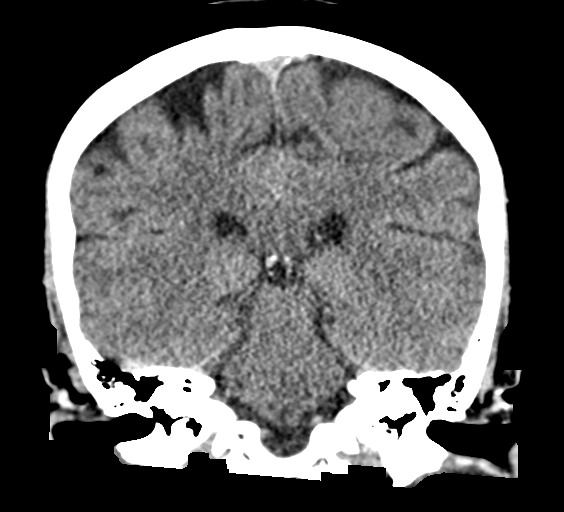
[im 40/72  brain]
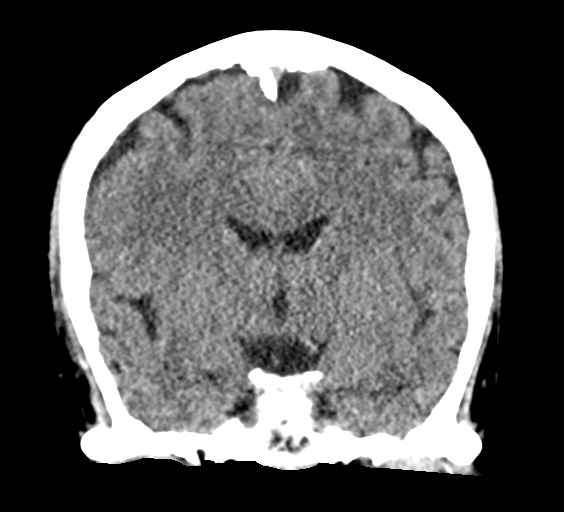

[Series 5: sag soft · sagittal · 0.31mm/px · 3 of 53 slices shown]
[im 18/53  brain]
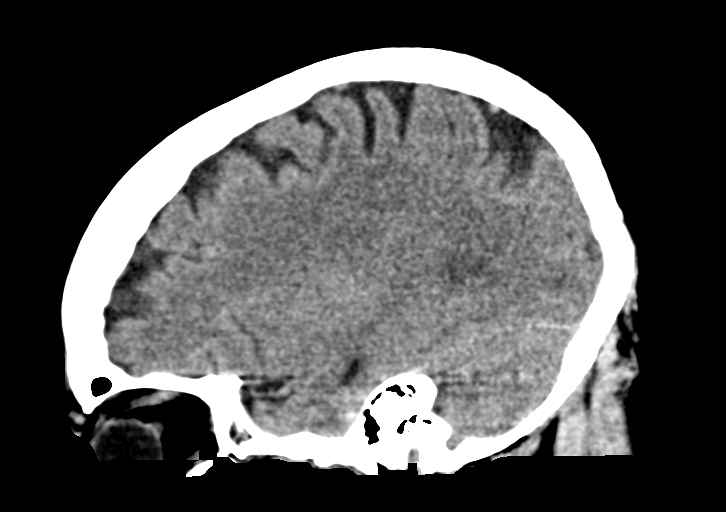
[im 27/53  brain]
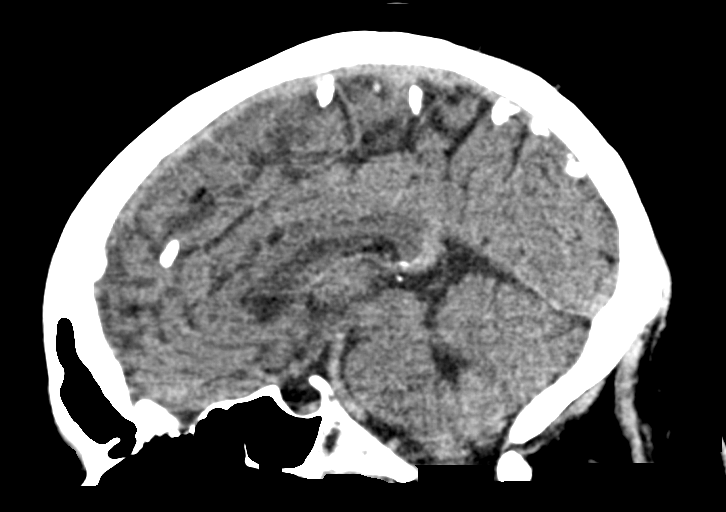
[im 35/53  brain]
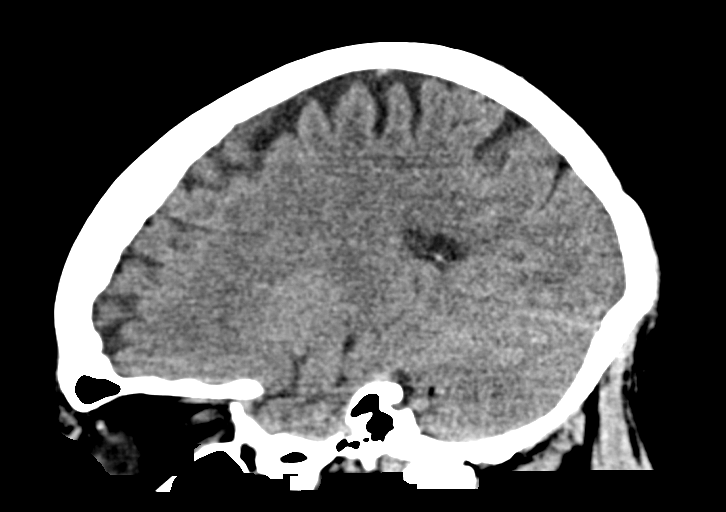

[15 of 47 positions shown; findings below may reference images not displayed]

FINDINGS: Brain: The ventricles are normal in size and configuration. No
extra-axial fluid collections are identified. The gray-white
differentiation is maintained. No CT findings for acute hemispheric
infarction or intracranial hemorrhage. No mass lesions. The
brainstem and cerebellum are normal.

Vascular: No hyperdense vessels or obvious aneurysm.

Skull: No acute skull fracture.  No bone lesion.

Sinuses/Orbits: The paranasal sinuses and mastoid air cells are
clear. The globes are intact.

Other: No scalp lesions, laceration or hematoma.
IMPRESSION: Normal head CT.
